# Patient Record
Sex: Male | Born: 1995 | Race: Black or African American | Hispanic: No | Marital: Single | Smoking: Never smoker
Health system: Southern US, Community
[De-identification: ages and names within clinical notes are randomized; demographics above are authoritative.]

## PROBLEM LIST (undated history)

## (undated) DIAGNOSIS — Z8669 Personal history of other diseases of the nervous system and sense organs: Secondary | ICD-10-CM

## (undated) HISTORY — PX: WRIST SURGERY: SHX841

---
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
## 2016-04-28 ENCOUNTER — Encounter: Payer: Self-pay | Admitting: Urgent Care

## 2016-04-28 DIAGNOSIS — R079 Chest pain, unspecified: Secondary | ICD-10-CM | POA: Insufficient documentation

## 2016-04-28 LAB — BASIC METABOLIC PANEL
Anion gap: 9 (ref 5–15)
BUN: 16 mg/dL (ref 6–20)
CALCIUM: 9.2 mg/dL (ref 8.9–10.3)
CO2: 26 mmol/L (ref 22–32)
CREATININE: 1.03 mg/dL (ref 0.61–1.24)
Chloride: 101 mmol/L (ref 101–111)
GFR calc Af Amer: >60 mL/min (ref >60)
GLUCOSE: 87 mg/dL (ref 65–99)
POTASSIUM: 3.2 mmol/L — AB (ref 3.5–5.1)
SODIUM: 136 mmol/L (ref 135–145)

## 2016-04-28 LAB — CBC
HEMATOCRIT: 42.2 % (ref 40.0–52.0)
Hemoglobin: 14.6 g/dL (ref 13.0–18.0)
MCH: 30.4 pg (ref 26.0–34.0)
MCHC: 34.5 g/dL (ref 32.0–36.0)
MCV: 88.2 fL (ref 80.0–100.0)
PLATELETS: 233 10*3/uL (ref 150–440)
RBC: 4.79 MIL/uL (ref 4.40–5.90)
RDW: 13.1 % (ref 11.5–14.5)
WBC: 8.2 10*3/uL (ref 3.8–10.6)

## 2016-04-28 LAB — TROPONIN I

## 2016-04-28 NOTE — ED Notes (Signed)
Patient presents to the ED with c/o chest pain. Patient reports that pain started yesterday while he was working at [employer]. Patient reports that he got fired for no reason and his chest started to hurt. Patient states, "The steam came up in my face and I couldn't breathe. I felt like I was going to pass out." Patient with continued chest pain today.

## 2016-04-29 ENCOUNTER — Emergency Department
Admission: EM | Admit: 2016-04-29 | Discharge: 2016-04-29 | Disposition: A | Discharge status: Home / Self Care | Payer: Self-pay | Attending: Emergency Medicine | Admitting: Emergency Medicine

## 2016-04-29 DIAGNOSIS — R079 Chest pain, unspecified: Secondary | ICD-10-CM

## 2016-04-29 NOTE — Discharge Instructions (Signed)
Nonspecific Chest Pain  °Chest pain can be caused by many different conditions. There is always a chance that your pain could be related to something serious, such as a heart attack or a blood clot in your lungs. Chest pain can also be caused by conditions that are not life-threatening. If you have chest pain, it is very important to follow up with your health care provider. °CAUSES  °Chest pain can be caused by: °· Heartburn. °· Pneumonia or bronchitis. °· Anxiety or stress. °· Inflammation around your heart (pericarditis) or lung (pleuritis or pleurisy). °· A blood clot in your lung. °· A collapsed lung (pneumothorax). It can develop suddenly on its own (spontaneous pneumothorax) or from trauma to the chest. °· Shingles infection (varicella-zoster virus). °· Heart attack. °· Damage to the bones, muscles, and cartilage that make up your chest wall. This can include: °¨ Bruised bones due to injury. °¨ Strained muscles or cartilage due to frequent or repeated coughing or overwork. °¨ Fracture to one or more ribs. °¨ Sore cartilage due to inflammation (costochondritis). °RISK FACTORS  °Risk factors for chest pain may include: °· Activities that increase your risk for trauma or injury to your chest. °· Respiratory infections or conditions that cause frequent coughing. °· Medical conditions or overeating that can cause heartburn. °· Heart disease or family history of heart disease. °· Conditions or health behaviors that increase your risk of developing a blood clot. °· Having had chicken pox (varicella zoster). °SIGNS AND SYMPTOMS °Chest pain can feel like: °· Burning or tingling on the surface of your chest or deep in your chest. °· Crushing, pressure, aching, or squeezing pain. °· Dull or sharp pain that is worse when you move, cough, or take a deep breath. °· Pain that is also felt in your back, neck, shoulder, or arm, or pain that spreads to any of these areas. °Your chest pain may come and go, or it may stay  constant. °DIAGNOSIS °Lab tests or other studies may be needed to find the cause of your pain. Your health care provider may have you take a test called an ambulatory ECG (electrocardiogram). An ECG records your heartbeat patterns at the time the test is performed. You may also have other tests, such as: °· Transthoracic echocardiogram (TTE). During echocardiography, sound waves are used to create a picture of all of the heart structures and to look at how blood flows through your heart. °· Transesophageal echocardiogram (TEE). This is a more advanced imaging test that obtains images from inside your body. It allows your health care provider to see your heart in finer detail. °· Cardiac monitoring. This allows your health care provider to monitor your heart rate and rhythm in real time. °· Holter monitor. This is a portable device that records your heartbeat and can help to diagnose abnormal heartbeats. It allows your health care provider to track your heart activity for several days, if needed. °· Stress tests. These can be done through exercise or by taking medicine that makes your heart beat more quickly. °· Blood tests. °· Imaging tests. °TREATMENT  °Your treatment depends on what is causing your chest pain. Treatment may include: °· Medicines. These may include: °¨ Acid blockers for heartburn. °¨ Anti-inflammatory medicine. °¨ Pain medicine for inflammatory conditions. °¨ Antibiotic medicine, if an infection is present. °¨ Medicines to dissolve blood clots. °¨ Medicines to treat coronary artery disease. °· Supportive care for conditions that do not require medicines. This may include: °¨ Resting. °¨ Applying heat   or cold packs to injured areas. °¨ Limiting activities until pain decreases. °HOME CARE INSTRUCTIONS °· If you were prescribed an antibiotic medicine, finish it all even if you start to feel better. °· Avoid any activities that bring on chest pain. °· Do not use any tobacco products, including  cigarettes, chewing tobacco, or electronic cigarettes. If you need help quitting, ask your health care provider. °· Do not drink alcohol. °· Take medicines only as directed by your health care provider. °· Keep all follow-up visits as directed by your health care provider. This is important. This includes any further testing if your chest pain does not go away. °· If heartburn is the cause for your chest pain, you may be told to keep your head raised (elevated) while sleeping. This reduces the chance that acid will go from your stomach into your esophagus. °· Make lifestyle changes as directed by your health care provider. These may include: °¨ Getting regular exercise. Ask your health care provider to suggest some activities that are safe for you. °¨ Eating a heart-healthy diet. A registered dietitian can help you to learn healthy eating options. °¨ Maintaining a healthy weight. °¨ Managing diabetes, if necessary. °¨ Reducing stress. °SEEK MEDICAL CARE IF: °· Your chest pain does not go away after treatment. °· You have a rash with blisters on your chest. °· You have a fever. °SEEK IMMEDIATE MEDICAL CARE IF:  °· Your chest pain is worse. °· You have an increasing cough, or you cough up blood. °· You have severe abdominal pain. °· You have severe weakness. °· You faint. °· You have chills. °· You have sudden, unexplained chest discomfort. °· You have sudden, unexplained discomfort in your arms, back, neck, or jaw. °· You have shortness of breath at any time. °· You suddenly start to sweat, or your skin gets clammy. °· You feel nauseous or you vomit. °· You suddenly feel light-headed or dizzy. °· Your heart begins to beat quickly, or it feels like it is skipping beats. °These symptoms may represent a serious problem that is an emergency. Do not wait to see if the symptoms will go away. Get medical help right away. Call your local emergency services (911 in the [place] Do not drive yourself to the hospital. °  °This  information is not intended to replace advice given to you by your health care provider. Make sure you discuss any questions you have with your health care provider. °  °Document Released: 08/10/2005 Document Revised: 11/21/2014 Document Reviewed: 06/06/2014 °Elsevier Interactive Patient Education ©2016 [employer] ° °

## 2016-04-29 NOTE — ED Provider Notes (Signed)
[facility] Emergency Department Provider Note  ____________________________________________  Time seen: 2:00 AM  I have reviewed the triage vital signs and the nursing notes.   HISTORY  Chief Complaint Chest Pain      HPI Fernando Kemp is a 20 y.o. male presents with history of chest pain which is since resolved while working at [employer]. Patient states that the air condition unit at [employer] is not working and a such while he was working yesterday was very hot. Patient states he felt as though he was going to "pass out". As a result the patient stated that he informed his manager that he needed to seek medical attention however he was informed that if he did so he would be terminated. Patient states that he left work and following doing so his symptoms resolved. Patient denies any chest pain at present no shortness of breath     Past medical history None There are no active problems to display for this patient.   Past Surgical History  Procedure Laterality Date  . Wrist surgery Left     No current outpatient prescriptions on file.  Allergies Review of patient's allergies indicates no known allergies.  No family history on file.  Social History Social History  Substance Use Topics  . Smoking status: Never Smoker   . Smokeless tobacco: None  . Alcohol Use: No    Review of Systems  Constitutional: Negative for fever. Eyes: Negative for visual changes. ENT: Negative for sore throat. Cardiovascular: Negative for chest pain. Respiratory: Negative for shortness of breath. Gastrointestinal: Negative for abdominal pain, vomiting and diarrhea. Genitourinary: Negative for dysuria. Musculoskeletal: Negative for back pain. Skin: Negative for rash. Neurological: Negative for headaches, focal weakness or numbness.   10-point ROS otherwise negative.  ____________________________________________   PHYSICAL EXAM:  VITAL  SIGNS: ED Triage Vitals  Enc Vitals Group     BP 04/28/16 2311 152/82 mmHg     Pulse Rate 04/28/16 2311 66     Resp 04/28/16 2311 16     Temp 04/28/16 2311 98.6 F (37 C)     Temp Source 04/28/16 2311 Oral     SpO2 04/28/16 2311 100 %     Weight 04/28/16 2308 181 lb 6.4 oz (82.283 kg)     Height 04/28/16 2308  (1.727 m)     Head Cir --      Peak Flow --      Pain Score 04/28/16 2308 7     Pain Loc --      Pain Edu? --      Excl. in GC? --      Constitutional: Alert and oriented. Well appearing and in no distress. Eyes: Conjunctivae are normal. PERRL. Normal extraocular movements. ENT   Head: Normocephalic and atraumatic.   Nose: No congestion/rhinnorhea.   Mouth/Throat: Mucous membranes are moist.   Neck: No stridor. Hematological/Lymphatic/Immunilogical: No cervical lymphadenopathy. Cardiovascular: Normal rate, regular rhythm. Normal and symmetric distal pulses are present in all extremities. No murmurs, rubs, or gallops. Respiratory: Normal respiratory effort without tachypnea nor retractions. Breath sounds are clear and equal bilaterally. No wheezes/rales/rhonchi. Gastrointestinal: Soft and nontender. No distention. There is no CVA tenderness. Genitourinary: deferred Musculoskeletal: Nontender with normal range of motion in all extremities. No joint effusions.  No lower extremity tenderness nor edema. Neurologic:  Normal speech and language. No gross focal neurologic deficits are appreciated. Speech is normal.  Skin:  Skin is warm, dry and intact. No rash noted.  Psychiatric: Mood and affect are normal. Speech and behavior are normal. Patient exhibits appropriate insight and judgment.  ____________________________________________    LABS (pertinent positives/negatives) Labs Reviewed  BASIC METABOLIC PANEL - Abnormal; Notable for the following:    Potassium 3.2 (*)    All other components within normal limits  CBC  TROPONIN I      ____________________________________________   EKG  ED ECG REPORT I, Orland N Shontae Rosiles, the attending physician, personally viewed and interpreted this ECG.   Date: 04/29/2016  EKG Time: 11:09 PM  Rate: 97  Rhythm: Normal sinus rhythm  Axis: Normal  Intervals: Normal  ST&T Change: None       INITIAL IMPRESSION / ASSESSMENT AND PLAN / ED COURSE  Pertinent labs & imaging results that were available during my care of the patient were reviewed by me and considered in my medical decision making (see chart for details).  Suspect possible heat exhaustion as etiology for the patient's symptoms yesterday. Patient has no symptoms at present. States he came to the emergency department because he needed a work note permission to return to work ____________________________________________   FINAL CLINICAL IMPRESSION(S) / ED DIAGNOSES  Final diagnoses:  Chest pain, unspecified chest pain type      Darci Currentandolph N Corazon Nickolas, MD 04/29/16 [phone]

## 2017-03-06 ENCOUNTER — Emergency Department
Admission: EM | Admit: 2017-03-06 | Discharge: 2017-03-06 | Disposition: A | Discharge status: Home / Self Care | Payer: 59 | Attending: Emergency Medicine | Admitting: Emergency Medicine

## 2017-03-06 DIAGNOSIS — L249 Irritant contact dermatitis, unspecified cause: Secondary | ICD-10-CM | POA: Diagnosis not present

## 2017-03-06 DIAGNOSIS — N4889 Other specified disorders of penis: Secondary | ICD-10-CM | POA: Diagnosis present

## 2017-03-06 MED ORDER — HYDROCORTISONE 2.5 % EX OINT: TOPICAL_OINTMENT | Freq: Two times a day (BID) | CUTANEOUS | 0 refills | Status: DC

## 2017-03-06 NOTE — ED Triage Notes (Signed)
Pt states he needs to be checked "down there". Pt states he saw some bumps on his penis today.

## 2017-03-06 NOTE — Discharge Instructions (Signed)
Please follow up with the [facility] Department for full STD screening if symptoms are not improving.  If you are using condoms or lubricants, they may be the cause of your symptoms. Change brands if so.  Return to the ER for symptoms that change or worsen if unable to schedule an appointment.

## 2017-03-06 NOTE — ED Provider Notes (Signed)
[facility] Emergency Department Provider Note  ___________________________________________   First MD Initiated Contact with Patient 03/06/17 1820     (approximate)  I have reviewed the triage vital signs and the nursing notes.   HISTORY  Chief Complaint Possible STD  HPI Fernando Kemp is a 21 y.o. male who presents to the emergency department for evaluation of STDs. He states he noticed some bumps on his penis today.Patient denies any dysuria or penile discharge. He has not had any pain. He is very vague about his symptoms and does not provide any details in recent use of condoms or lubricants.  No past medical history on file.  There are no active problems to display for this patient.   Past Surgical History:  Procedure Laterality Date  . WRIST SURGERY Left     Prior to Admission medications   Medication Sig Start Date End Date Taking? Authorizing Provider  hydrocortisone 2.5 % ointment Apply topically 2 (two) times daily. 03/06/17   Chinita Pester, FNP    Allergies Patient has no known allergies.  No family history on file.  Social History Social History  Substance Use Topics  . Smoking status: Never Smoker  . Smokeless tobacco: Not on file  . Alcohol use No    Review of Systems Constitutional: No fever/chills Gastrointestinal: No abdominal pain.  No nausea, no vomiting.   Genitourinary: Negative for dysuria. Negative for penile discharge. Musculoskeletal: Negative for back pain. Skin: Positive for "bumps" on glans Neurological: Negative for headaches, focal weakness or numbness. ____________________________________________   PHYSICAL EXAM:  VITAL SIGNS: ED Triage Vitals [03/06/17 1743]  Enc Vitals Group     BP (!) 130/97     Pulse Rate 76     Resp 18     Temp 98.3 F (36.8 C)     Temp Source Oral     SpO2 98 %     Weight 187 lb (84.8 kg)     Height  (1.727 m)     Head Circumference      Peak Flow      Pain  Score      Pain Loc      Pain Edu?      Excl. in GC?     Constitutional: Alert and oriented. Well appearing and in no acute distress. Eyes: Conjunctivae are normal. EOMI. Respiratory: Normal respiratory effort.  No retractions.  Gastrointestinal: Soft and nontender. No distention.  Genitourinary: Skin colored papules on glans of penis without ulceration/umbilication that do not appear wartlike. Papules are diffuse and localized to the glans and on the cuff, but do not spread to the shaft.  Musculoskeletal: No lower extremity tenderness nor edema.  No joint effusions. Neurologic:  Normal speech and language. No gross focal neurologic deficits are appreciated.  Skin:  Skin is warm, dry and intact. No rash noted. See GU exam above. Psychiatric: Mood and affect are normal. Speech and behavior are normal. ____________________________________________   LABS (all labs ordered are listed, but only abnormal results are displayed)  Labs Reviewed - No data to display ____________________________________________  EKG  Not indicated ____________________________________________  RADIOLOGY  Not indicated. ____________________________________________   PROCEDURES  Procedure(s) performed: None  Procedures  Critical Care performed: No  ____________________________________________   INITIAL IMPRESSION / ASSESSMENT AND PLAN / ED COURSE  Pertinent labs & imaging results that were available during my care of the patient were reviewed by me and considered in my medical decision making (see chart for  details).  21 year old male presenting to the emergency department after noticing "bumps" on his penis this morning. He denies having a history of the same. He does not specifically tell me that he has had any recent lubricants or condom use, however symptoms and exam seem to be consistent with an irritant/contact dermatitis. He was given a prescription for some hydrocortisone cream and advised  to follow-up at the free STD clinic at the health department for a full STD exam if the symptoms do not resolve over the next few days. ____________________________________________   FINAL CLINICAL IMPRESSION(S) / ED DIAGNOSES  Final diagnoses:  Irritant contact dermatitis, unspecified trigger      NEW MEDICATIONS STARTED DURING THIS VISIT:  Discharge Medication List as of 03/06/2017  6:44 PM    START taking these medications   Details  hydrocortisone 2.5 % ointment Apply topically 2 (two) times daily., Starting Mon 03/06/2017, Print         Note:  This document was prepared using Dragon voice recognition software and may include unintentional dictation errors.    Chinita Pester, FNP 03/06/17 2012    Jeanmarie Plant, MD 03/06/17 2150

## 2017-04-24 ENCOUNTER — Encounter: Payer: Self-pay | Admitting: Emergency Medicine

## 2017-04-24 ENCOUNTER — Emergency Department: Payer: 59

## 2017-04-24 ENCOUNTER — Emergency Department
Admission: EM | Admit: 2017-04-24 | Discharge: 2017-04-24 | Disposition: A | Discharge status: Home / Self Care | Payer: 59 | Attending: Emergency Medicine | Admitting: Emergency Medicine

## 2017-04-24 DIAGNOSIS — Y939 Activity, unspecified: Secondary | ICD-10-CM | POA: Diagnosis not present

## 2017-04-24 DIAGNOSIS — S6701XA Crushing injury of right thumb, initial encounter: Secondary | ICD-10-CM | POA: Diagnosis not present

## 2017-04-24 DIAGNOSIS — W231XXA Caught, crushed, jammed, or pinched between stationary objects, initial encounter: Secondary | ICD-10-CM | POA: Insufficient documentation

## 2017-04-24 DIAGNOSIS — Y929 Unspecified place or not applicable: Secondary | ICD-10-CM | POA: Diagnosis not present

## 2017-04-24 DIAGNOSIS — Y999 Unspecified external cause status: Secondary | ICD-10-CM | POA: Insufficient documentation

## 2017-04-24 MED ORDER — NAPROXEN 500 MG PO TABS
500.0000 mg | ORAL_TABLET | Freq: Once | ORAL | Status: AC
  Administered 2017-04-24: 500 mg via ORAL
  Filled 2017-04-24: qty 1

## 2017-04-24 MED ORDER — NAPROXEN 500 MG PO TABS: 500.0000 mg | ORAL_TABLET | Freq: Two times a day (BID) | ORAL | 0 refills | Status: DC

## 2017-04-24 NOTE — ED Provider Notes (Signed)
[facility] Emergency Department Provider Note ____________________________________________  Time seen: Approximately 8:08 PM  I have reviewed the triage vital signs and the nursing notes.   HISTORY  Chief Complaint Finger Injury    HPI Fernando Kemp is a 21 y.o. male who presents to the emergency department for evaluation ofright thumb pain. While moving furniture today he smashed his right thumb. He has some swelling and bruising noted to the base of the thumbnail. He has not taken any medications for pain relief prior to arrival.  History reviewed. No pertinent past medical history.  There are no active problems to display for this patient.   Past Surgical History:  Procedure Laterality Date  . WRIST SURGERY Left     Prior to Admission medications   Medication Sig Start Date End Date Taking? Authorizing Provider  hydrocortisone 2.5 % ointment Apply topically 2 (two) times daily. 03/06/17   Azaiah Licciardi, Rulon Eisenmengerari B, FNP  naproxen (NAPROSYN) 500 MG tablet Take 1 tablet (500 mg total) by mouth 2 (two) times daily with a meal. 04/24/17   Feliz Lincoln B, FNP    Allergies Patient has no known allergies.  History reviewed. No pertinent family history.  Social History Social History  Substance Use Topics  . Smoking status: Never Smoker  . Smokeless tobacco: Never Used  . Alcohol use No    Review of Systems Constitutional: Negative for fever or recent injury Respiratory: Negative for shortness of breath or cough Musculoskeletal: Positive for right thumb pain post injury Skin: Positive for bruising and swelling over the right thumb  Neurological: Negative for decrease in sensation of the right thumb or paresthesias.  ____________________________________________   PHYSICAL EXAM:  VITAL SIGNS: ED Triage Vitals  Enc Vitals Group     BP 04/24/17 1926 (!) 140/91     Pulse Rate 04/24/17 1926 90     Resp 04/24/17 1926 16     Temp 04/24/17 1926 98 F  (36.7 C)     Temp Source 04/24/17 1926 Oral     SpO2 04/24/17 1926 99 %     Weight 04/24/17 1925 187 lb (84.8 kg)     Height --      Head Circumference --      Peak Flow --      Pain Score 04/24/17 1925 6     Pain Loc --      Pain Edu? --      Excl. in GC? --     Constitutional: Alert and oriented. Well appearing and in no acute distress. Eyes: Conjunctivae are clear bilaterally  Head: Atraumatic Neck: Active, full range of motion Respiratory: Respirations are even and unlabored Musculoskeletal: Full, active range of motion throughout, specifically the right hand. There is focal tenderness over the base of the right thumbnail without injury to the nail itself. Neurologic: Sensation is intact, specifically of the right thumb.  Skin: Contusion noted at the base of the right thumbnail.  Psychiatric: Normal affect and behavior.  ____________________________________________   LABS (all labs ordered are listed, but only abnormal results are displayed)  Labs Reviewed - No data to display ____________________________________________  RADIOLOGY  Images of the right thumb is negative for acute bony abnormality per radiology. ____________________________________________   PROCEDURES  Procedure(s) performed: Static splint was applied to the right thumb by RN. Patient was neurovascularly intact post-application.  ____________________________________________   INITIAL IMPRESSION / ASSESSMENT AND PLAN / ED COURSE  Fernando Kemp is a 21 y.o. male who presents to  the emergency department for evaluation of injury sustained to the right thumb today. Images and exam are both consistent. No concern of indication of fracture. Splint was applied for comfort and protection until area is no longer tender and his heel. He was instructed to follow up with his primary care provider or orthopedics for symptoms that are not improving over the week. He is to return to the emergency department for  symptoms that change or worsen if he is unable to schedule an appointment.  Pertinent labs & imaging results that were available during my care of the patient were reviewed by me and considered in my medical decision making (see chart for details).  _________________________________________   FINAL CLINICAL IMPRESSION(S) / ED DIAGNOSES  Final diagnoses:  Crush injury to thumb, right, initial encounter    New Prescriptions   NAPROXEN (NAPROSYN) 500 MG TABLET    Take 1 tablet (500 mg total) by mouth 2 (two) times daily with a meal.    If controlled substance prescribed during this visit, 12 month history viewed on the [facility] prior to issuing an initial prescription for Schedule II or III opiod.    Fernando Pester, FNP 04/24/17 2027    Myrna Blazer, MD 04/24/17 2111

## 2017-04-24 NOTE — ED Triage Notes (Signed)
Pt ambulatory to triage with steady gait, no distress noted. Pt reports he was moving furniture today when he smashed his right thumb into a wall. Pts right thumb is bruised and swollen, no obvious deformity noted at this time.

## 2017-04-24 NOTE — ED Notes (Addendum)
See triage note..the patient reports to ED w/ c/o pain/injury to R thumb after injury. Bruising and swelling noted to area, thumbnail intact. Circulation, sensation, motor function intact. Pt A/OX4, resp even and unlabored. NAD

## 2017-04-24 NOTE — Discharge Instructions (Signed)
Follow up with the primary care or orthopedic doctor for symptoms that are not improving over the next few days. Return to the ER for symptoms that change or worsen if unable to schedule an appointment.

## 2017-09-13 ENCOUNTER — Encounter: Payer: Self-pay | Admitting: Emergency Medicine

## 2017-09-13 ENCOUNTER — Emergency Department
Admission: EM | Admit: 2017-09-13 | Discharge: 2017-09-13 | Disposition: A | Discharge status: Home / Self Care | Payer: 59 | Attending: Emergency Medicine | Admitting: Emergency Medicine

## 2017-09-13 DIAGNOSIS — R1110 Vomiting, unspecified: Secondary | ICD-10-CM | POA: Diagnosis present

## 2017-09-13 DIAGNOSIS — R112 Nausea with vomiting, unspecified: Secondary | ICD-10-CM | POA: Diagnosis not present

## 2017-09-13 LAB — CBC
HCT: 40.6 % (ref 40.0–52.0)
HEMOGLOBIN: 13.8 g/dL (ref 13.0–18.0)
MCH: 29.7 pg (ref 26.0–34.0)
MCHC: 34 g/dL (ref 32.0–36.0)
MCV: 87.5 fL (ref 80.0–100.0)
PLATELETS: 227 10*3/uL (ref 150–440)
RBC: 4.64 MIL/uL (ref 4.40–5.90)
RDW: 13.1 % (ref 11.5–14.5)
WBC: 7.2 10*3/uL (ref 3.8–10.6)

## 2017-09-13 LAB — URINALYSIS, COMPLETE (UACMP) WITH MICROSCOPIC
Bacteria, UA: NONE SEEN
Bilirubin Urine: NEGATIVE
GLUCOSE, UA: NEGATIVE mg/dL
HGB URINE DIPSTICK: NEGATIVE
KETONES UR: NEGATIVE mg/dL
LEUKOCYTES UA: NEGATIVE
NITRITE: NEGATIVE
PH: 6 (ref 5.0–8.0)
PROTEIN: 30 mg/dL — AB
RBC / HPF: NONE SEEN RBC/hpf (ref 0–5)
Specific Gravity, Urine: 1.027 (ref 1.005–1.030)
Squamous Epithelial / LPF: NONE SEEN

## 2017-09-13 LAB — COMPREHENSIVE METABOLIC PANEL
ALK PHOS: 80 U/L (ref 38–126)
ALT: 30 U/L (ref 17–63)
ANION GAP: 7 (ref 5–15)
AST: 28 U/L (ref 15–41)
Albumin: 4.3 g/dL (ref 3.5–5.0)
BUN: 17 mg/dL (ref 6–20)
CHLORIDE: 104 mmol/L (ref 101–111)
CO2: 26 mmol/L (ref 22–32)
Calcium: 9.1 mg/dL (ref 8.9–10.3)
Creatinine, Ser: 1.1 mg/dL (ref 0.61–1.24)
Glucose, Bld: 122 mg/dL — ABNORMAL HIGH (ref 65–99)
POTASSIUM: 3.5 mmol/L (ref 3.5–5.1)
SODIUM: 137 mmol/L (ref 135–145)
Total Bilirubin: 0.7 mg/dL (ref 0.3–1.2)
Total Protein: 8 g/dL (ref 6.5–8.1)

## 2017-09-13 LAB — LIPASE, BLOOD: LIPASE: 16 U/L (ref 11–51)

## 2017-09-13 MED ORDER — ONDANSETRON 4 MG PO TBDP
4.0000 mg | ORAL_TABLET | Freq: Once | ORAL | Status: AC
  Administered 2017-09-13: 4 mg via ORAL
  Filled 2017-09-13: qty 1

## 2017-09-13 MED ORDER — ONDANSETRON 4 MG PO TBDP: 4.0000 mg | ORAL_TABLET | Freq: Three times a day (TID) | ORAL | 0 refills | Status: DC | PRN

## 2017-09-13 NOTE — ED Triage Notes (Signed)
Pt states started vomiting around 1330 states x 2 with mid abd pain. States [place] around 1300 not sure if it is from food.

## 2017-09-13 NOTE — ED Provider Notes (Signed)
[facility] Emergency Department Provider Note  Time seen: 8:32 PM  I have reviewed the triage vital signs and the nursing notes.   HISTORY  Chief Complaint Emesis    HPI Fernando Kemp is a 21 y.o. male with no past medical history who presents to the emergency department for nausea vomiting.  According to the patient he was eating spaghetti around 1:00 and around 130 he vomited up.  He states since he has not had any further vomiting but does continue to feel nauseated with an upset stomach.  Denies diarrhea.  Denies any abdominal "pain."  No fever.   History reviewed. No pertinent past medical history.  There are no active problems to display for this patient.   Past Surgical History:  Procedure Laterality Date  . WRIST SURGERY Left     Prior to Admission medications   Medication Sig Start Date End Date Taking? Authorizing Provider  hydrocortisone 2.5 % ointment Apply topically 2 (two) times daily. 03/06/17   Triplett, Rulon Eisenmenger B, FNP  naproxen (NAPROSYN) 500 MG tablet Take 1 tablet (500 mg total) by mouth 2 (two) times daily with a meal. 04/24/17   Triplett, Cari B, FNP    No Known Allergies  No family history on file.  Social History Social History  Substance Use Topics  . Smoking status: Never Smoker  . Smokeless tobacco: Never Used  . Alcohol use No    Review of Systems Constitutional: Negative for fever. Cardiovascular: Negative for chest pain. Respiratory: Negative for shortness of breath. Gastrointestinal: Negative for abdominal pain.  Positive for nausea and vomiting.  Negative for diarrhea Neurological: Negative for headache All other ROS negative  ____________________________________________   PHYSICAL EXAM:  VITAL SIGNS: ED Triage Vitals [09/13/17 1933]  Enc Vitals Group     BP 134/78     Pulse Rate 96     Resp 20     Temp 98.6 F (37 C)     Temp Source Oral     SpO2 99 %     Weight 190 lb (86.2 kg)     Height 5\' 8"   (1.727 m)     Head Circumference      Peak Flow      Pain Score 6     Pain Loc      Pain Edu?      Excl. in GC?     Constitutional: Alert and oriented. Well appearing and in no distress. Eyes: Normal exam ENT   Head: Normocephalic and atraumatic   Mouth/Throat: Mucous membranes are moist. Cardiovascular: Normal rate, regular rhythm. No murmur Respiratory: Normal respiratory effort without tachypnea nor retractions. Breath sounds are clear  Gastrointestinal: Soft and nontender. No distention.   Musculoskeletal: Nontender with normal range of motion in all extremities.  Neurologic:  Normal speech and language. No gross focal neurologic deficits Skin:  Skin is warm, dry and intact.  Psychiatric: Mood and affect are normal.     INITIAL IMPRESSION / ASSESSMENT AND PLAN / ED COURSE  Pertinent labs & imaging results that were available during my care of the patient were reviewed by me and considered in my medical decision making (see chart for details).  Patient presents to the emergency department for nausea and vomiting beginning around 1:30 PM today.  Differential includes gastroenteritis, emesis, pancreatitis, biliary dysfunction.  Overall the patient appears extremely well with a completely nontender abdominal exam.  Continue states some nausea.  We will does a one-time dose of ODT Zofran  and discharged with the same to be used as needed.  I discussed supportive care at home including fluids and Imodium if needed.  ____________________________________________   FINAL CLINICAL IMPRESSION(S) / ED DIAGNOSES  Emesis    Minna AntisPaduchowski, Angeligue Bowne, MD 09/13/17 2034

## 2018-02-18 ENCOUNTER — Other Ambulatory Visit: Payer: Self-pay

## 2018-02-18 ENCOUNTER — Emergency Department: Payer: 59

## 2018-02-18 ENCOUNTER — Encounter: Payer: Self-pay | Admitting: Emergency Medicine

## 2018-02-18 DIAGNOSIS — Y92039 Unspecified place in apartment as the place of occurrence of the external cause: Secondary | ICD-10-CM | POA: Diagnosis not present

## 2018-02-18 DIAGNOSIS — Y999 Unspecified external cause status: Secondary | ICD-10-CM | POA: Insufficient documentation

## 2018-02-18 DIAGNOSIS — S60221A Contusion of right hand, initial encounter: Secondary | ICD-10-CM | POA: Diagnosis not present

## 2018-02-18 DIAGNOSIS — Y939 Activity, unspecified: Secondary | ICD-10-CM | POA: Insufficient documentation

## 2018-02-18 DIAGNOSIS — R079 Chest pain, unspecified: Secondary | ICD-10-CM | POA: Diagnosis present
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
## 2018-02-19 ENCOUNTER — Emergency Department
Admission: EM | Admit: 2018-02-19 | Discharge: 2018-02-19 | Disposition: A | Discharge status: Home / Self Care | Payer: 59 | Attending: Emergency Medicine | Admitting: Emergency Medicine

## 2018-02-19 DIAGNOSIS — R0789 Other chest pain: Secondary | ICD-10-CM

## 2018-02-19 DIAGNOSIS — S60221A Contusion of right hand, initial encounter: Secondary | ICD-10-CM

## 2018-02-19 MED ORDER — IBUPROFEN 800 MG PO TABS: 800.0000 mg | ORAL_TABLET | Freq: Three times a day (TID) | ORAL | 0 refills | Status: DC | PRN

## 2018-02-19 MED ORDER — KETOROLAC TROMETHAMINE 60 MG/2ML IM SOLN
60.0000 mg | Freq: Once | INTRAMUSCULAR | Status: AC
  Administered 2018-02-19: 60 mg via INTRAMUSCULAR
  Filled 2018-02-19: qty 2
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
## 2018-02-19 NOTE — Discharge Instructions (Signed)
Please follow-up with the acute care clinic.  Please return with any other conditions or any concerns.
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
## 2018-04-16 ENCOUNTER — Ambulatory Visit
Admission: EM | Admit: 2018-04-16 | Discharge: 2018-04-16 | Disposition: A | Discharge status: Home / Self Care | Payer: 59 | Attending: Family Medicine | Admitting: Family Medicine

## 2018-04-16 ENCOUNTER — Other Ambulatory Visit: Payer: Self-pay

## 2018-04-16 DIAGNOSIS — R51 Headache: Secondary | ICD-10-CM

## 2018-04-16 DIAGNOSIS — R519 Headache, unspecified: Secondary | ICD-10-CM

## 2018-04-16 NOTE — ED Provider Notes (Signed)
[facility]    CSN: [id number] Arrival date & time: 04/16/18  1603     History   Chief Complaint Chief Complaint  Patient presents with  . Headache    HPI Fernando Kemp is a 22 y.o. male.   Presents to the urgent care facility for evaluation of headache.  Patient states earlier today around lunch he was eating, got hot and dizzy and developed a headache and one episode of vomiting.  Patient states he is no longer dizzy, hot and without nausea and vomiting.  His headache has improved tremendously and is currently 5 out of 10.  Patient has a history of migraine headaches with similar symptoms preceding his headache.  He has not take any medications for his pain.  He denies any sore throat, abdominal pain, rashes, vision changes.  No trauma or injury.  Denies worst headache of his life.  HPI  History reviewed. No pertinent past medical history.  There are no active problems to display for this patient.   Past Surgical History:  Procedure Laterality Date  . WRIST SURGERY Left        Home Medications    Prior to Admission medications   Medication Sig Start Date End Date Taking? Authorizing Provider  ibuprofen (ADVIL,MOTRIN) 800 MG tablet Take 1 tablet (800 mg total) by mouth every 8 (eight) hours as needed. 02/19/18   Rebecka ApleyWebster, Allison P, MD    Family History History reviewed. No pertinent family history.  Social History Social History   Tobacco Use  . Smoking status: Never Smoker  . Smokeless tobacco: Never Used  Substance Use Topics  . Alcohol use: No  . Drug use: Not Currently    Types: Marijuana     Allergies   Patient has no known allergies.   Review of Systems Review of Systems  Constitutional: Negative for fever.  HENT: Negative for sore throat and trouble swallowing.   Respiratory: Negative for shortness of breath.   Cardiovascular: Negative for chest pain.  Gastrointestinal: Positive for nausea and vomiting. Negative for abdominal pain.    Genitourinary: Negative for difficulty urinating, dysuria and urgency.  Musculoskeletal: Negative for back pain and myalgias.  Skin: Negative for rash.  Neurological: Positive for headaches. Negative for dizziness and light-headedness.     Physical Exam Triage Vital Signs ED Triage Vitals  Enc Vitals Group     BP 04/16/18 1618 (!) 142/86     Pulse Rate 04/16/18 1618 (!) 53     Resp 04/16/18 1618 18     Temp 04/16/18 1618 98.9 F (37.2 C)     Temp Source 04/16/18 1618 Oral     SpO2 04/16/18 1618 98 %     Weight 04/16/18 1618 180 lb (81.6 kg)     Height 04/16/18 1618 5\' 6"  (1.676 m)     Head Circumference --      Peak Flow --      Pain Score 04/16/18 1617 6     Pain Loc --      Pain Edu? --      Excl. in GC? --    No data found.  Updated Vital Signs BP (!) 142/86 (BP Location: Left Arm)   Pulse (!) 53   Temp 98.9 F (37.2 C) (Oral)   Resp 18   Ht 5\' 6"  (1.676 m)   Wt 180 lb (81.6 kg)   SpO2 98%   BMI 29.05 kg/m   Visual Acuity Right Eye Distance:   Left Eye  Distance:   Bilateral Distance:    Right Eye Near:   Left Eye Near:    Bilateral Near:     Physical Exam  Constitutional: He is oriented to person, place, and time. He appears well-developed and well-nourished.  HENT:  Head: Normocephalic and atraumatic.  Mouth/Throat: Oropharynx is clear and moist. No oropharyngeal exudate.  Eyes: Conjunctivae are normal.  Neck: Normal range of motion.  Cardiovascular: Normal rate.  Pulmonary/Chest: Effort normal. No respiratory distress.  Abdominal: Soft. He exhibits no distension. There is no tenderness.  Musculoskeletal: Normal range of motion.  Lymphadenopathy:    He has no cervical adenopathy.  Neurological: He is alert and oriented to person, place, and time. He has normal strength. He is not disoriented. He displays a negative Romberg sign. Coordination and gait normal.  Skin: Skin is warm. No rash noted.  Psychiatric: He has a normal mood and affect. His  behavior is normal. Thought content normal.     UC Treatments / Results  Labs (all labs ordered are listed, but only abnormal results are displayed) Labs Reviewed - No data to display  EKG None  Radiology No results found.  Procedures Procedures (including critical care time)  Medications Ordered in UC Medications - No data to display  Initial Impression / Assessment and Plan / UC Course  I have reviewed the triage vital signs and the nursing notes.  Pertinent labs & imaging results that were available during my care of the patient were reviewed by me and considered in my medical decision making (see chart for details).     22 year old male with headache earlier today that similar to his normal headaches.  He had some preceding symptoms of feeling hot dizzy and one episode of nausea and vomiting.  He is no longer hot dizzy and without nausea and vomiting.  His headache is 5 out of 10.  This is not the worst headache of his life.  Patient appears well no apparent distress.  Patient states he is here for the work note.  Patient educated on signs and symptoms return to clinic for.  He will treat headache with Tylenol and ibuprofen. Final Clinical Impressions(s) / UC Diagnoses   Final diagnoses:  Acute nonintractable headache, unspecified headache type     Discharge Instructions     Please take Tylenol and ibuprofen as needed for any pain.  Return to the urgent care facility for any worsening symptoms or urgent changes in health.   ED Prescriptions    None       Evon Slack, [place] 04/16/18 1728

## 2018-04-16 NOTE — ED Triage Notes (Signed)
Patient complains of headache that occurred after eating [employer]. Patient states that he got hot, and nausea after eating and vomited. Patient denies any vomiting since. States that now his head is hurting.

## 2018-04-16 NOTE — Discharge Instructions (Addendum)
Please take Tylenol and ibuprofen as needed for any pain.  Return to the urgent care facility for any worsening symptoms or urgent changes in health.

## 2018-08-14 ENCOUNTER — Encounter (HOSPITAL_COMMUNITY): Payer: Self-pay | Admitting: Emergency Medicine

## 2018-08-14 ENCOUNTER — Emergency Department (HOSPITAL_COMMUNITY)
Admission: EM | Admit: 2018-08-14 | Discharge: 2018-08-14 | Disposition: A | Discharge status: Home / Self Care | Payer: 59 | Attending: Emergency Medicine | Admitting: Emergency Medicine

## 2018-08-14 ENCOUNTER — Other Ambulatory Visit: Payer: Self-pay

## 2018-08-14 DIAGNOSIS — R51 Headache: Secondary | ICD-10-CM | POA: Insufficient documentation

## 2018-08-14 DIAGNOSIS — R519 Headache, unspecified: Secondary | ICD-10-CM

## 2018-08-14 MED ORDER — ACETAMINOPHEN 325 MG PO TABS
650.0000 mg | ORAL_TABLET | Freq: Once | ORAL | Status: AC
  Administered 2018-08-14: 650 mg via ORAL
  Filled 2018-08-14: qty 2

## 2018-08-14 MED ORDER — IBUPROFEN 800 MG PO TABS: 800.0000 mg | ORAL_TABLET | Freq: Three times a day (TID) | ORAL | 0 refills | Status: DC

## 2018-08-14 NOTE — Discharge Instructions (Signed)
Follow-up with your primary doctor or return to ER for any worsening symptoms.

## 2018-08-14 NOTE — ED Notes (Signed)
Pt states migraine began this afternoon and has been progressively getting worse. 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 Pt denies taking any medications for this and states he has been staying hydrated. Pt denies N/V or dizziness.

## 2018-08-14 NOTE — ED Triage Notes (Signed)
Headache since 1400.  Denies any other s/s

## 2018-08-16 NOTE — ED Provider Notes (Signed)
[facility] EMERGENCY DEPARTMENT Provider Note   CSN: [id number] Arrival date & time: 08/14/18  1832     History   Chief Complaint Chief Complaint  Patient presents with  . Migraine    HPI Fernando Kemp is a 22 y.o. male.  HPI   Fernando Kemp is a 22 y.o. male who presents to the Emergency Department complaining of left-sided headache that began gradually on the afternoon of arrival.  He describes the pain as a throbbing sensation to the left side of his head.  He states that his headache has since improved after eating and now describes the headache as minimal.  He denies visual changes, vomiting, neck pain, dizziness, numbness or weakness of the extremities.  Reports history of frequent headaches.  History reviewed. No pertinent past medical history.  There are no active problems to display for this patient.   Past Surgical History:  Procedure Laterality Date  . WRIST SURGERY Left      Home Medications    Prior to Admission medications   Medication Sig Start Date End Date Taking? Authorizing Provider  ibuprofen (ADVIL,MOTRIN) 800 MG tablet Take 1 tablet (800 mg total) by mouth 3 (three) times daily. 08/14/18   Mandalyn Pasqua, Babette Relic, PA-C    Family History No family history on file.  Social History Social History   Tobacco Use  . Smoking status: Never Smoker  . Smokeless tobacco: Never Used  Substance Use Topics  . Alcohol use: No  . Drug use: Not Currently    Types: Marijuana     Allergies   Patient has no known allergies.   Review of Systems Review of Systems  Constitutional: Negative for activity change, appetite change and fever.  HENT: Negative for facial swelling and trouble swallowing.   Eyes: Negative for photophobia and visual disturbance.  Respiratory: Negative for shortness of breath.   Cardiovascular: Negative for chest pain.  Gastrointestinal: Negative for nausea and vomiting.  Musculoskeletal: Negative for neck pain and neck stiffness.    Skin: Negative for rash and wound.  Neurological: Positive for headaches. Negative for dizziness, syncope, facial asymmetry, speech difficulty, weakness and numbness.  Psychiatric/Behavioral: Negative for confusion and decreased concentration.     Physical Exam Updated Vital Signs BP (!) 145/74 (BP Location: Right Arm)   Pulse 80   Temp 98.6 F (37 C) (Oral)   Resp 18   Ht 5\' 6"  (1.676 m)   Wt 81.6 kg   SpO2 96%   BMI 29.05 kg/m   Physical Exam  Constitutional: He is oriented to person, place, and time. He appears well-developed and well-nourished. No distress.  HENT:  Head: Normocephalic and atraumatic.  Mouth/Throat: Oropharynx is clear and moist.  Eyes: Pupils are equal, round, and reactive to light. Conjunctivae and EOM are normal.  Neck: Normal range of motion, full passive range of motion without pain and phonation normal. Neck supple. No spinous process tenderness and no muscular tenderness present. No neck rigidity. No Kernig's sign noted.  Cardiovascular: Normal rate, regular rhythm and intact distal pulses.  Pulmonary/Chest: Effort normal and breath sounds normal. No respiratory distress.  Musculoskeletal: Normal range of motion.  Neurological: He is alert and oriented to person, place, and time. He has normal strength. No sensory deficit. Coordination and gait normal. GCS eye subscore is 4. GCS verbal subscore is 5. GCS motor subscore is 6.  Reflex Scores:      Tricep reflexes are 2+ on the right side and 2+ on the left side.  Bicep reflexes are 2+ on the right side and 2+ on the left side. CN III-XII grossly intact, speech clear,  Skin: Skin is warm and dry. Capillary refill takes less than 2 seconds. No rash noted.  Psychiatric: He has a normal mood and affect. Thought content normal.  Nursing note and vitals reviewed.    ED Treatments / Results  Labs (all labs ordered are listed, but only abnormal results are displayed) Labs Reviewed - No data to  display  EKG None  Radiology No results found.  Procedures Procedures (including critical care time)  Medications Ordered in ED Medications  acetaminophen (TYLENOL) tablet 650 mg (650 mg Oral Given 08/14/18 2023)     Initial Impression / Assessment and Plan / ED Course  I have reviewed the triage vital signs and the nursing notes.  Pertinent labs & imaging results that were available during my care of the patient were reviewed by me and considered in my medical decision making (see chart for details).     Upon entering the room, patient reporting headache has improved after eating.  There is fast food bags in the room.  Patient is laughing and talking with friend at bedside.  No focal neuro deficits.  No meningeal signs.  Patient appears appropriate for discharge home  Final Clinical Impressions(s) / ED Diagnoses   Final diagnoses:  Bad headache    ED Discharge Orders         Ordered    ibuprofen (ADVIL,MOTRIN) 800 MG tablet  3 times daily     08/14/18 2021           Pauline Aus, PA-C 08/16/18 0054    Raeford Razor, MD 08/24/18 2317

## 2018-12-07 ENCOUNTER — Other Ambulatory Visit: Payer: Self-pay

## 2018-12-07 ENCOUNTER — Encounter: Payer: Self-pay | Admitting: Emergency Medicine

## 2018-12-07 ENCOUNTER — Ambulatory Visit
Admission: EM | Admit: 2018-12-07 | Discharge: 2018-12-07 | Disposition: A | Discharge status: Home / Self Care | Payer: 59 | Attending: Family Medicine | Admitting: Family Medicine

## 2018-12-07 DIAGNOSIS — G43019 Migraine without aura, intractable, without status migrainosus: Secondary | ICD-10-CM | POA: Diagnosis not present

## 2018-12-07 HISTORY — DX: Personal history of other diseases of the nervous system and sense organs: Z86.69

## 2018-12-07 MED ORDER — IBUPROFEN 600 MG PO TABS
600.0000 mg | ORAL_TABLET | Freq: Once | ORAL | Status: AC
  Administered 2018-12-07: 600 mg via ORAL

## 2018-12-07 MED ORDER — IBUPROFEN 600 MG PO TABS: 600.0000 mg | ORAL_TABLET | Freq: Four times a day (QID) | ORAL | 1 refills | Status: AC | PRN

## 2018-12-07 NOTE — Discharge Instructions (Addendum)
You were given Ibuprofen today to help with the headache. You may also take Ibuprofen 600mg  every 6 hours as needed for headache and pain- Rx sent to your pharmacy. Continue to monitor symptoms and rest. Recommend keep a journal of frequency of headaches and possible triggers. Encouraged to take Ibuprofen or Tylenol as soon as headache starts so that it can be treated quickly. Follow-up here if headache does not improve or go to the ER if headache worsens.

## 2018-12-07 NOTE — ED Triage Notes (Signed)
Pt c/o headache. Started about 9 am this morning. Denies any other symptoms. He has not taken anything for this today.

## 2018-12-07 NOTE — ED Provider Notes (Signed)
[facility]    CSN: [id number] Arrival date & time: 12/07/18  1116     History   Chief Complaint Chief Complaint  Patient presents with  . Headache    HPI Fernando Kemp is a 23 y.o. male.   23 year old male presents with frontal headache that started at work at [employer]9am today. Pain continues in frontal area with associated photophobia, and increase in pain with movement. Denies any nausea, vomiting, dizziness or distinct vision changes. Also denies any URI symptoms, cough, numbness or other neurological symptoms. This headache is similar to previous migraine headaches. He usually takes Ibuprofen with good success but did not take any medication this morning. Sleep also helps. He has frequent tension-type headaches but only gets migraines every few months. He left work today due to the headache and needs a note. No other chronic health issues. Takes no daily medication.   The history is provided by the patient.    Past Medical History:  Diagnosis Date  . History of migraine headaches     There are no active problems to display for this patient.   Past Surgical History:  Procedure Laterality Date  . WRIST SURGERY Left        Home Medications    Prior to Admission medications   Medication Sig Start Date End Date Taking? Authorizing Provider  ibuprofen (ADVIL,MOTRIN) 600 MG tablet Take 1 tablet (600 mg total) by mouth every 6 (six) hours as needed for headache. 12/07/18   Isay Perleberg, Ali LoweAnn Berry, NP    Family History Family History  Problem Relation Age of Onset  . Healthy Mother   . Healthy Father     Social History Social History   Tobacco Use  . Smoking status: Never Smoker  . Smokeless tobacco: Never Used  Substance Use Topics  . Alcohol use: No  . Drug use: Not Currently    Types: Marijuana     Allergies   Patient has no known allergies.   Review of Systems Review of Systems  Constitutional: Negative for activity change, appetite change,  chills, fatigue and fever.  HENT: Negative for congestion, ear discharge, ear pain, facial swelling, hearing loss, mouth sores, nosebleeds, postnasal drip, rhinorrhea, sinus pressure, sinus pain, sneezing, sore throat, tinnitus and trouble swallowing.   Eyes: Positive for photophobia. Negative for pain, discharge, redness, itching and visual disturbance.  Respiratory: Negative for cough, chest tightness, shortness of breath and wheezing.   Cardiovascular: Negative for chest pain and palpitations.  Gastrointestinal: Negative for abdominal pain, diarrhea, nausea and vomiting.  Musculoskeletal: Negative for arthralgias, back pain, myalgias, neck pain and neck stiffness.  Skin: Negative for color change, rash and wound.  Allergic/Immunologic: Negative for environmental allergies and immunocompromised state.  Neurological: Positive for headaches. Negative for dizziness, tremors, seizures, syncope, facial asymmetry, speech difficulty, weakness, light-headedness and numbness.  Hematological: Negative for adenopathy. Does not bruise/bleed easily.     Physical Exam Triage Vital Signs ED Triage Vitals  Enc Vitals Group     BP 12/07/18 1246 130/82     Pulse Rate 12/07/18 1246 84     Resp 12/07/18 1246 18     Temp 12/07/18 1246 98.3 F (36.8 C)     Temp Source 12/07/18 1246 Oral     SpO2 12/07/18 1246 99 %     Weight 12/07/18 1244 186 lb (84.4 kg)     Height 12/07/18 1244 5\' 8"  (1.727 m)     Head Circumference --  Peak Flow --      Pain Score 12/07/18 1244 7     Pain Loc --      Pain Edu? --      Excl. in [place]? --    No data found.  Updated Vital Signs BP 130/82 (BP Location: Left Arm)   Pulse 84   Temp 98.3 F (36.8 C) (Oral)   Resp 18   Ht 5\' 8"  (1.727 m)   Wt 186 lb (84.4 kg)   SpO2 99%   BMI 28.28 kg/m   Visual Acuity Right Eye Distance:   Left Eye Distance:   Bilateral Distance:    Right Eye Near:   Left Eye Near:    Bilateral Near:     Physical Exam Vitals signs  reviewed.  Constitutional:      General: He is awake. He is not in acute distress.    Appearance: Normal appearance. He is well-developed, well-groomed and normal weight. He is not ill-appearing.     Comments: Patient sitting comfortably on exam table in no acute distress.   HENT:     Head: Normocephalic and atraumatic.     Jaw: There is normal jaw occlusion.     Right Ear: Hearing, tympanic membrane, ear canal and external ear normal.     Left Ear: Hearing, tympanic membrane, ear canal and external ear normal.     Nose: Nose normal.     Mouth/Throat:     Lips: Pink.     Mouth: Mucous membranes are moist.     Pharynx: Oropharynx is clear.  Eyes:     Extraocular Movements: Extraocular movements intact.     Conjunctiva/sclera: Conjunctivae normal.     Pupils: Pupils are equal, round, and reactive to light.  Neck:     Musculoskeletal: Normal range of motion and neck supple. No neck rigidity or muscular tenderness.  Cardiovascular:     Rate and Rhythm: Normal rate and regular rhythm.     Pulses: Normal pulses.     Heart sounds: Normal heart sounds. No murmur.  Pulmonary:     Effort: Pulmonary effort is normal. No respiratory distress.     Breath sounds: Normal air entry. No decreased breath sounds, wheezing, rhonchi or rales.  Musculoskeletal: Normal range of motion.  Skin:    General: Skin is warm and dry.     Capillary Refill: Capillary refill takes less than 2 seconds.     Findings: No rash.  Neurological:     General: No focal deficit present.     Mental Status: He is alert and oriented to person, place, and time.     Cranial Nerves: Cranial nerves are intact.     Sensory: Sensation is intact.     Motor: Motor function is intact.     Coordination: Coordination is intact.     Gait: Gait is intact.     Deep Tendon Reflexes: Reflexes are normal and symmetric.  Psychiatric:        Attention and Perception: Attention normal.        Mood and Affect: Mood and affect normal.         Speech: Speech normal.        Behavior: Behavior normal. Behavior is cooperative.        Thought Content: Thought content normal.        Judgment: Judgment normal.      UC Treatments / Results  Labs (all labs ordered are listed, but only abnormal results are displayed) Labs Reviewed -  No data to display  EKG None  Radiology No results found.  Procedures Procedures (including critical care time)  Medications Ordered in UC Medications  ibuprofen (ADVIL,MOTRIN) tablet 600 mg (600 mg Oral Given 12/07/18 1416)    Initial Impression / Assessment and Plan / UC Course  I have reviewed the triage vital signs and the nursing notes.  Pertinent labs & imaging results that were available during my care of the patient were reviewed by me and considered in my medical decision making (see chart for details).    Gave Ibuprofen 600mg  now to help with pain/headache. Offered Naproxen or longer acting anti-inflammatory to help with headaches but patient declined. May continue using Ibuprofen 600mg  every 8 hours as needed for headache. Encouraged to take Ibuprofen as soon as headache starts to minimize severity of headache. Recommend continue to monitor headache pattern to determine any triggers. Note written for work for today. Follow-up here in 2 to 3 days if headache does not resolve or go to the ER if pain/headache worsens.  Final Clinical Impressions(s) / UC Diagnoses   Final diagnoses:  Intractable migraine without aura and without status migrainosus     Discharge Instructions     You were given Ibuprofen today to help with the headache. You may also take Ibuprofen 600mg  every 6 hours as needed for headache and pain- Rx sent to your pharmacy. Continue to monitor symptoms and rest. Recommend keep a journal of frequency of headaches and possible triggers. Encouraged to take Ibuprofen or Tylenol as soon as headache starts so that it can be treated quickly. Follow-up here if headache does not  improve or go to the ER if headache worsens.     ED Prescriptions    Medication Sig Dispense Auth. Provider   ibuprofen (ADVIL,MOTRIN) 600 MG tablet Take 1 tablet (600 mg total) by mouth every 6 (six) hours as needed for headache. 30 tablet Sudie Grumbling, NP     Controlled Substance Prescriptions [place] Controlled Substance Registry consulted? N/A   Sudie Grumbling, NP 12/07/18 [phone]

## 2019-01-07 ENCOUNTER — Other Ambulatory Visit: Payer: Self-pay

## 2019-01-07 ENCOUNTER — Encounter: Payer: Self-pay | Admitting: *Deleted

## 2019-01-07 ENCOUNTER — Emergency Department
Admission: EM | Admit: 2019-01-07 | Discharge: 2019-01-07 | Disposition: A | Discharge status: Home / Self Care | Payer: 59 | Attending: Emergency Medicine | Admitting: Emergency Medicine

## 2019-01-07 DIAGNOSIS — J029 Acute pharyngitis, unspecified: Secondary | ICD-10-CM

## 2019-01-07 LAB — GROUP A STREP BY PCR: GROUP A STREP BY PCR: NOT DETECTED

## 2019-01-07 MED ORDER — ACETAMINOPHEN 325 MG PO TABS
650.0000 mg | ORAL_TABLET | Freq: Once | ORAL | Status: DC | PRN
  Filled 2019-01-07: qty 2

## 2019-01-07 MED ORDER — NAPROXEN 500 MG PO TABS: 500.0000 mg | ORAL_TABLET | Freq: Two times a day (BID) | ORAL | 0 refills | Status: DC

## 2019-01-07 NOTE — ED Provider Notes (Signed)
[facility] Emergency Department Provider Note  ____________________________________________  Time seen: Approximately 10:04 PM  I have reviewed the triage vital signs and the nursing notes.   HISTORY  Chief Complaint Sore Throat    HPI Fernando Kemp is a 23 y.o. male who presents to the emergency department for treatment and evaluation of sore throat that started this morning.  No relief with over-the-counter throat spray.  Pain increases with swallowing.   Past Medical History:  Diagnosis Date  . History of migraine headaches     There are no active problems to display for this patient.   Past Surgical History:  Procedure Laterality Date  . WRIST SURGERY Left     Prior to Admission medications   Medication Sig Start Date End Date Taking? Authorizing Provider  ibuprofen (ADVIL,MOTRIN) 600 MG tablet Take 1 tablet (600 mg total) by mouth every 6 (six) hours as needed for headache. 12/07/18   Amyot, Ali Lowe, NP  naproxen (NAPROSYN) 500 MG tablet Take 1 tablet (500 mg total) by mouth 2 (two) times daily with a meal. 01/07/19   Mervil Wacker B, FNP    Allergies Patient has no known allergies.  Family History  Problem Relation Age of Onset  . Healthy Mother   . Healthy Father     Social History Social History   Tobacco Use  . Smoking status: Never Smoker  . Smokeless tobacco: Never Used  Substance Use Topics  . Alcohol use: No  . Drug use: Not Currently    Types: Marijuana    Review of Systems Constitutional: Negative for fever. Eyes: No visual changes. ENT: Positive for sore throat; negative for difficulty swallowing. Respiratory: Denies shortness of breath. Gastrointestinal: Negative for abdominal pain.  No nausea, no vomiting.  No diarrhea.  Genitourinary: Negative for dysuria. Negative for decrease in need to void. Musculoskeletal: Negative for generalized body aches. Skin: Negative for rash. Neurological: Negative for  headaches, negative for focal weakness or numbness.  ____________________________________________   PHYSICAL EXAM:  VITAL SIGNS: ED Triage Vitals  Enc Vitals Group     BP 01/07/19 2032 124/86     Pulse Rate 01/07/19 2032 (!) 104     Resp 01/07/19 2032 20     Temp 01/07/19 2032 98.6 F (37 C)     Temp Source 01/07/19 2032 Oral     SpO2 01/07/19 2032 99 %     Weight 01/07/19 2033 189 lb (85.7 kg)     Height 01/07/19 2033 5\' 8"  (1.727 m)     Head Circumference --      Peak Flow --      Pain Score 01/07/19 2040 6     Pain Loc --      Pain Edu? --      Excl. in GC? --     Constitutional: Alert and oriented. Well appearing and in no acute distress. Eyes: Conjunctivae are normal.  Head: Atraumatic. Nose: No congestion/rhinnorhea. Mouth/Throat: Mucous membranes are moist.  Oropharynx mildly erythematous, tonsils 1+ without exudate. Uvula is midline. Ears: Right tympanic membrane appears normal. Left tympanic membrane appears normal. Neck: No stridor. Voice normal Lymphatic: Anterior cervical nodes not palpable. Cardiovascular: Normal rate, regular rhythm. Good peripheral circulation. Respiratory: Normal respiratory effort. Lungs CTAB. Gastrointestinal: Soft and nontender. Musculoskeletal: FROM of neck, upper and lower extremities. Neurologic:  Normal speech and language. No gross focal neurologic deficits are appreciated. Skin:  Skin is warm, dry and intact. No rash noted Psychiatric: Mood and affect are  normal. Speech and behavior are normal.  ____________________________________________   LABS (all labs ordered are listed, but only abnormal results are displayed)  Labs Reviewed  GROUP A STREP BY PCR   ____________________________________________  EKG  Not indiated ____________________________________________  RADIOLOGY  Not indicated. ____________________________________________   PROCEDURES  Procedure(s) performed: None  Critical Care performed:  No ____________________________________________   INITIAL IMPRESSION / ASSESSMENT AND PLAN / ED COURSE  23 year old male presents to the emergency department for evaluation of sore throat that started this morning.  Strep by PCR is negative.  He will be treated symptomatically with Naprosyn.  Patient requested a work note for today as well.  He was advised to follow-up with urgent care with a primary care provider of his choice for symptoms that are not improving over the next few days.  He is to return to the emergency department for symptoms of change or worsen if he is unable to schedule appointment.  Pertinent labs & imaging results that were available during my care of the patient were reviewed by me and considered in my medical decision making (see chart for details). ____________________________________________  New Prescriptions   NAPROXEN (NAPROSYN) 500 MG TABLET    Take 1 tablet (500 mg total) by mouth 2 (two) times daily with a meal.    FINAL CLINICAL IMPRESSION(S) / ED DIAGNOSES  Final diagnoses:  Viral pharyngitis    If controlled substance prescribed during this visit, 12 month history viewed on the [facility] prior to issuing an initial prescription for Schedule II or III opiod.   Note:  This document was prepared using Dragon voice recognition software and may include unintentional dictation errors.    Chinita Pester, FNP 01/07/19 2216    Phineas Semen, MD 01/07/19 [phone]

## 2019-01-07 NOTE — ED Triage Notes (Signed)
Pt c/o sore throat since this morning. Pt states he has used OTC "throat spray" for his pain w/o relief. Pt has taken no other meds for pain. Pt denies fever prior to arrival and is presently afebrile. Pt c/o pain w/ swallowing but is controlling secretions and able to drink fluids.

## 2019-09-13 ENCOUNTER — Encounter: Payer: Self-pay | Admitting: Emergency Medicine

## 2019-09-13 ENCOUNTER — Other Ambulatory Visit: Payer: Self-pay

## 2019-09-13 ENCOUNTER — Ambulatory Visit
Admission: EM | Admit: 2019-09-13 | Discharge: 2019-09-13 | Disposition: A | Discharge status: Home / Self Care | Payer: 59 | Attending: Family Medicine | Admitting: Family Medicine

## 2019-09-13 DIAGNOSIS — R009 Unspecified abnormalities of heart beat: Secondary | ICD-10-CM | POA: Diagnosis not present

## 2019-09-13 DIAGNOSIS — R519 Headache, unspecified: Secondary | ICD-10-CM

## 2019-09-13 DIAGNOSIS — J029 Acute pharyngitis, unspecified: Secondary | ICD-10-CM | POA: Insufficient documentation

## 2019-09-13 LAB — RAPID STREP SCREEN (MED CTR MEBANE ONLY): Streptococcus, Group A Screen (Direct): NEGATIVE

## 2019-09-13 NOTE — ED Provider Notes (Signed)
[facility]    CSN: [id number] Arrival date & time: 09/13/19  0920  History   Chief Complaint Chief Complaint  Patient presents with  . Sore Throat  . Headache   HPI  23 year old male presents with the above complaints.  Patient reports intermittent headaches over the past week.  Developed sore throat this morning.  Patient states that he has had coworkers test positive with COVID-19.  He is concerned about COVID-19 and would like to be tested.  Denies fever.  No reports of cough or shortness of breath.  No known exacerbating relieving factors.  He rates his pain as 5/10 in severity.  No other complaints or concerns at this time.  PMH, Surgical Hx, Family Hx, Social History reviewed and updated as below.  Past Medical History:  Diagnosis Date  . History of migraine headaches    Past Surgical History:  Procedure Laterality Date  . WRIST SURGERY Left    Home Medications    Prior to Admission medications   Medication Sig Start Date End Date Taking? Authorizing Provider  ibuprofen (ADVIL,MOTRIN) 600 MG tablet Take 1 tablet (600 mg total) by mouth every 6 (six) hours as needed for headache. 12/07/18   Amyot, Nicholes Stairs, NP    Family History Family History  Problem Relation Age of Onset  . Healthy Mother   . Healthy Father     Social History Social History   Tobacco Use  . Smoking status: Never Smoker  . Smokeless tobacco: Never Used  Substance Use Topics  . Alcohol use: No  . Drug use: Not Currently    Types: Marijuana     Allergies   Patient has no known allergies.   Review of Systems Review of Systems  Constitutional: Negative for fever.  HENT: Positive for sore throat.   Neurological: Positive for headaches.   Physical Exam Triage Vital Signs ED Triage Vitals  Enc Vitals Group     BP 09/13/19 0934 124/84     Pulse Rate 09/13/19 0934 65     Resp 09/13/19 0934 16     Temp 09/13/19 0934 98.7 F (37.1 C)     Temp Source 09/13/19 0934  Oral     SpO2 09/13/19 0934 97 %     Weight 09/13/19 0931 196 lb (88.9 kg)     Height 09/13/19 0931 5\' 8"  (1.727 m)     Head Circumference --      Peak Flow --      Pain Score 09/13/19 0930 5     Pain Loc --      Pain Edu? --      Excl. in [place]? --    Updated Vital Signs BP 124/84 (BP Location: Left Arm)   Pulse 65   Temp 98.7 F (37.1 C) (Oral)   Resp 16   Ht 5\' 8"  (1.727 m)   Wt 88.9 kg   SpO2 97%   BMI 29.80 kg/m   Visual Acuity Right Eye Distance:   Left Eye Distance:   Bilateral Distance:    Right Eye Near:   Left Eye Near:    Bilateral Near:     Physical Exam Vitals signs and nursing note reviewed.  Constitutional:      General: He is not in acute distress.    Appearance: Normal appearance. He is not ill-appearing.  HENT:     Head: Normocephalic and atraumatic.     Right Ear: Tympanic membrane normal.     Left Ear: Tympanic membrane  normal.     Mouth/Throat:     Pharynx: Posterior oropharyngeal erythema present. No oropharyngeal exudate.  Eyes:     General:        Right eye: No discharge.        Left eye: No discharge.     Conjunctiva/sclera: Conjunctivae normal.  Cardiovascular:     Rate and Rhythm: Normal rate. Rhythm regularly irregular.  Pulmonary:     Effort: Pulmonary effort is normal.     Breath sounds: Normal breath sounds. No wheezing, rhonchi or rales.  Neurological:     Mental Status: He is alert.  Psychiatric:        Mood and Affect: Mood normal.        Behavior: Behavior normal.    UC Treatments / Results  Labs (all labs ordered are listed, but only abnormal results are displayed) Labs Reviewed  RAPID STREP SCREEN ([facility] ONLY)  NOVEL CORONAVIRUS, NAA (HOSP ORDER, SEND-OUT TO REF LAB; TAT 18-24 HRS)  CULTURE, GROUP A STREP [facility])    EKG Interpretation: Sinus rhythm with PACs with aberrant conduction.  Normal axis.  No ST or T wave changes.  Radiology No results found.  Procedures Procedures (including critical care  time)  Medications Ordered in UC Medications - No data to display  Initial Impression / Assessment and Plan / UC Course  I have reviewed the triage vital signs and the nursing notes.  Pertinent labs & imaging results that were available during my care of the patient were reviewed by me and considered in my medical decision making (see chart for details).    23 year old male presents with pharyngitis.  Strep negative.  Awaiting Covid test results.  Irregular heartbeat noted on exam.  See EKG findings above.  Advised to decrease caffeine intake.  Ibuprofen as needed.  Rest and supportive care.  Work note given.  Final Clinical Impressions(s) / UC Diagnoses   Final diagnoses:  Pharyngitis, unspecified etiology     Discharge Instructions     Rest.  Fluids.  Ibuprofen as needed.  Awaiting COVID test results.  Take care  Dr. Adriana Simas     ED Prescriptions    None     PDMP not reviewed this encounter.   Tommie Sams, DO 09/13/19 1037

## 2019-09-13 NOTE — ED Triage Notes (Signed)
Patient c/o sore throat and HAs that started today.  Patient states that a coworker tested positive a week ago.

## 2019-09-13 NOTE — Discharge Instructions (Signed)
Rest.  Fluids.  Ibuprofen as needed.  Awaiting COVID test results.  Take care  Dr. Lacinda Axon

## 2019-09-14 LAB — NOVEL CORONAVIRUS, NAA (HOSP ORDER, SEND-OUT TO REF LAB; TAT 18-24 HRS): SARS-CoV-2, NAA: NOT DETECTED
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
## 2019-09-16 LAB — CULTURE, GROUP A STREP (THRC)

## 2021-06-24 ENCOUNTER — Encounter: Payer: Self-pay | Admitting: Emergency Medicine

## 2021-06-24 ENCOUNTER — Other Ambulatory Visit: Payer: Self-pay

## 2021-06-24 ENCOUNTER — Ambulatory Visit
Admission: EM | Admit: 2021-06-24 | Discharge: 2021-06-24 | Disposition: A | Discharge status: Home / Self Care | Payer: 59 | Attending: Physician Assistant | Admitting: Physician Assistant

## 2021-06-24 DIAGNOSIS — B349 Viral infection, unspecified: Secondary | ICD-10-CM | POA: Insufficient documentation

## 2021-06-24 DIAGNOSIS — Z20822 Contact with and (suspected) exposure to covid-19: Secondary | ICD-10-CM | POA: Insufficient documentation

## 2021-06-24 DIAGNOSIS — J029 Acute pharyngitis, unspecified: Secondary | ICD-10-CM | POA: Diagnosis present

## 2021-06-24 DIAGNOSIS — M791 Myalgia, unspecified site: Secondary | ICD-10-CM | POA: Insufficient documentation

## 2021-06-24 LAB — POCT RAPID STREP A: Streptococcus, Group A Screen (Direct): NEGATIVE

## 2021-06-24 NOTE — ED Provider Notes (Signed)
[facility]    CSN: [id number] Arrival date & time: 06/24/21  1716      History   Chief Complaint Chief Complaint  Patient presents with   Generalized Body Aches   Covid Exposure    HPI Fernando Kemp is a 25 y.o. male presenting for onset of feeling feverish, scratchy/sore throat, and body aches (mostly lower backache) today.  Patient states he was exposed to COVID-19 through a coworker.  Patient has been vaccinated x2 for COVID-19.  He denies any recorded temperatures, headaches, cough, congestion/runny nose, chest pain or breathing difficulty, vomiting or diarrhea.  Not currently taking any OTC meds for symptoms.  He is otherwise healthy with only history of migraines.  No other complaints or concerns.  HPI  Past Medical History:  Diagnosis Date   History of migraine headaches     There are no problems to display for this patient.   Past Surgical History:  Procedure Laterality Date   WRIST SURGERY Left        Home Medications    Prior to Admission medications   Medication Sig Start Date End Date Taking? Authorizing Provider  ibuprofen (ADVIL,MOTRIN) 600 MG tablet Take 1 tablet (600 mg total) by mouth every 6 (six) hours as needed for headache. 12/07/18   Amyot, Ali Lowe, NP    Family History Family History  Problem Relation Age of Onset   Healthy Mother    Healthy Father     Social History Social History   Tobacco Use   Smoking status: Never   Smokeless tobacco: Never  Vaping Use   Vaping Use: Never used  Substance Use Topics   Alcohol use: No   Drug use: Not Currently    Types: Marijuana     Allergies   Patient has no known allergies.   Review of Systems Review of Systems  Constitutional:  Positive for fever. Negative for fatigue.  HENT:  Positive for sore throat. Negative for congestion, rhinorrhea, sinus pressure and sinus pain.   Respiratory:  Negative for cough and shortness of breath.   Gastrointestinal:  Negative for  abdominal pain, diarrhea, nausea and vomiting.  Musculoskeletal:  Positive for myalgias.  Neurological:  Negative for weakness, light-headedness and headaches.  Hematological:  Negative for adenopathy.    Physical Exam Triage Vital Signs ED Triage Vitals  Enc Vitals Group     BP 06/24/21 1733 121/78     Pulse Rate 06/24/21 1733 (!) 107     Resp 06/24/21 1733 18     Temp 06/24/21 1733 99.1 F (37.3 C)     Temp Source 06/24/21 1733 Oral     SpO2 06/24/21 1733 98 %     Weight 06/24/21 1732 195 lb 15.8 oz (88.9 kg)     Height 06/24/21 1732 5\' 8"  (1.727 m)     Head Circumference --      Peak Flow --      Pain Score 06/24/21 1732 7     Pain Loc --      Pain Edu? --      Excl. in GC? --    No data found.  Updated Vital Signs BP 121/78 (BP Location: Left Arm)   Pulse (!) 107   Temp 99.1 F (37.3 C) (Oral)   Resp 18   Ht 5\' 8"  (1.727 m)   Wt 195 lb 15.8 oz (88.9 kg)   SpO2 98%   BMI 29.80 kg/m   Physical Exam Vitals and nursing note reviewed.  Constitutional:      General: He is not in acute distress.    Appearance: Normal appearance. He is well-developed. He is not ill-appearing or diaphoretic.  HENT:     Head: Normocephalic and atraumatic.     Nose: Nose normal. No congestion.     Mouth/Throat:     Mouth: Mucous membranes are moist.     Pharynx: Oropharynx is clear. Uvula midline. Posterior oropharyngeal erythema present.     Tonsils: No tonsillar abscesses.  Eyes:     General: No scleral icterus.       Right eye: No discharge.        Left eye: No discharge.     Conjunctiva/sclera: Conjunctivae normal.  Neck:     Thyroid: No thyromegaly.     Trachea: No tracheal deviation.  Cardiovascular:     Rate and Rhythm: Regular rhythm. Tachycardia present.     Heart sounds: Normal heart sounds.  Pulmonary:     Effort: Pulmonary effort is normal. No respiratory distress.     Breath sounds: Normal breath sounds. No wheezing, rhonchi or rales.  Musculoskeletal:      Cervical back: Normal range of motion and neck supple.  Lymphadenopathy:     Cervical: No cervical adenopathy.  Skin:    General: Skin is warm and dry.     Findings: No rash.  Neurological:     General: No focal deficit present.     Mental Status: He is alert. Mental status is at baseline.     Motor: No weakness.     Gait: Gait normal.  Psychiatric:        Mood and Affect: Mood normal.        Behavior: Behavior normal.        Thought Content: Thought content normal.     UC Treatments / Results  Labs (all labs ordered are listed, but only abnormal results are displayed) Labs Reviewed  SARS CORONAVIRUS 2 (TAT 6-24 HRS)  CULTURE, GROUP A STREP [facility])  POCT RAPID STREP A, ED / UC  POCT RAPID STREP A    EKG   Radiology No results found.  Procedures Procedures (including critical care time)  Medications Ordered in UC Medications - No data to display  Initial Impression / Assessment and Plan / UC Course  I have reviewed the triage vital signs and the nursing notes.  Pertinent labs & imaging results that were available during my care of the patient were reviewed by me and considered in my medical decision making (see chart for details).  25 year old male presenting for onset of scratchy throat, body aches and feeling feverish today.  Possible exposure to COVID-19 through a coworker.  Vaccinated for COVID-19 x2.  Vitals all normal and stable patient is overall well-appearing.  He has mild posterior pharyngeal erythema.  The remainder the exam is within normal limits.  Rapid strep negative.  Culture sent.  PCR COVID test obtained.  Current CDC guidelines, isolation protocol and ED precautions reviewed with patient.  Advised patient if he has been exposed, there is a decent possibility he could have COVID-19 given any symptoms.  Supportive care encouraged with OTC medications.  Reviewed that if his COVID test is negative he likely has another viral URI which will take 7 to 10  days or his course and care still supportive.  Advise follow-up with [place] as needed.  Work note given.  Final Clinical Impressions(s) / UC Diagnoses   Final diagnoses:  Viral illness  Sore throat  Myalgia  Exposure to COVID-19 virus     Discharge Instructions      The rapid strep test was negative.  We are sending a culture and the culture ends up being positive in the next couple days we will call you and send in antibiotics.  Your symptoms are more likely due to a viral illness.  Since you have potentially been exposed to COVID-19, this is a possibility.  Our PCR test will be back tomorrow.  If it is positive you have COVID.  You can take an at home COVID test.  If it is positive you have COVID if it is negative and our test is positive then you have COVID.  Treatment is supportive so increase rest and fluids and can take Tylenol and or ibuprofen as needed for pain/discomfort.  Consider use of throat lozenges and Chloraseptic sprays.  See more information below.  If negative COVID test, this is likely other viral illness and should run its course in the next 7 to 10 days.  Care is still supportive.  You have received COVID testing today either for positive exposure, concerning symptoms that could be related to COVID infection, screening purposes, or re-testing after confirmed positive.  Your test obtained today checks for active viral infection in the last 1-2 weeks. If your test is negative now, you can still test positive later. So, if you do develop symptoms you should either get re-tested and/or isolate x 5 days and then strict mask use x 5 days (unvaccinated) or mask use x 10 days (vaccinated). Please follow CDC guidelines.  While Rapid antigen tests come back in 15-20 minutes, send out PCR/molecular test results typically come back within 1-3 days. In the mean time, if you are symptomatic, assume this could be a positive test and treat/monitor yourself as if you do have COVID.   We will  call with test results if positive. Please download the MyChart app and set up a profile to access test results.   If symptomatic, go home and rest. Push fluids. Take Tylenol as needed for discomfort. Gargle warm salt water. Throat lozenges. Take Mucinex DM or Robitussin for cough. Humidifier in bedroom to ease coughing. Warm showers. Also review the COVID handout for more information.  COVID-19 INFECTION: The incubation period of COVID-19 is approximately 14 days after exposure, with most symptoms developing in roughly 4-5 days. Symptoms may range in severity from mild to critically severe. Roughly 80% of those infected will have mild symptoms. People of any age may become infected with COVID-19 and have the ability to transmit the virus. The most common symptoms include: fever, fatigue, cough, body aches, headaches, sore throat, nasal congestion, shortness of breath, nausea, vomiting, diarrhea, changes in smell and/or taste.    COURSE OF ILLNESS Some patients may begin with mild disease which can progress quickly into critical symptoms. If your symptoms are worsening please call ahead to the Emergency Department and proceed there for further treatment. Recovery time appears to be roughly 1-2 weeks for mild symptoms and 3-6 weeks for severe disease.   GO IMMEDIATELY TO ER FOR FEVER YOU ARE UNABLE TO GET DOWN WITH TYLENOL, BREATHING PROBLEMS, CHEST PAIN, FATIGUE, LETHARGY, INABILITY TO EAT OR DRINK, ETC  QUARANTINE AND ISOLATION: To help decrease the spread of COVID-19 please remain isolated if you have COVID infection or are highly suspected to have COVID infection. This means -stay home and isolate to one room in the home if you live with others. Do not share  a bed or bathroom with others while ill, sanitize and wipe down all countertops and keep common areas clean and disinfected. Stay home for 5 days. If you have no symptoms or your symptoms are resolving after 5 days, you can leave your  house. Continue to wear a mask around others for 5 additional days. If you have been in close contact (within 6 feet) of someone diagnosed with COVID 19, you are advised to quarantine in your home for 14 days as symptoms can develop anywhere from 2-14 days after exposure to the virus. If you develop symptoms, you  must isolate.  Most current guidelines for COVID after exposure -unvaccinated: isolate 5 days and strict mask use x 5 days. Test on day 5 is possible -vaccinated: wear mask x 10 days if symptoms do not develop -You do not necessarily need to be tested for COVID if you have + exposure and  develop symptoms. Just isolate at home x10 days from symptom onset During this global pandemic, CDC advises to practice social distancing, try to stay at least 196ft away from others at all times. Wear a face covering. Wash and sanitize your hands regularly and avoid going anywhere that is not necessary.  KEEP IN MIND THAT THE COVID TEST IS NOT 100% ACCURATE AND YOU SHOULD STILL DO EVERYTHING TO PREVENT POTENTIAL SPREAD OF VIRUS TO OTHERS (WEAR MASK, WEAR GLOVES, WASH HANDS AND SANITIZE REGULARLY). IF INITIAL TEST IS NEGATIVE, THIS MAY NOT MEAN YOU ARE DEFINITELY NEGATIVE. MOST ACCURATE TESTING IS DONE 5-7 DAYS AFTER EXPOSURE.   It is not advised by CDC to get re-tested after receiving a positive COVID test since you can still test positive for weeks to months after you have already cleared the virus.   *If you have not been vaccinated for COVID, I strongly suggest you consider getting vaccinated as long as there are no contraindications.       ED Prescriptions   None    PDMP not reviewed this encounter.   Shirlee Latchaves, Jamel Holzmann B, PA-C 06/24/21 1803

## 2021-06-24 NOTE — ED Triage Notes (Signed)
Pt c/o scratchy throat, body aches and subjective fever. Started this morning. He states he was exposed to covid by someone at work.

## 2021-06-24 NOTE — Discharge Instructions (Addendum)
The rapid strep test was negative.  We are sending a culture and the culture ends up being positive in the next couple days we will call you and send in antibiotics.  Your symptoms are more likely due to a viral illness.  Since you have potentially been exposed to COVID-19, this is a possibility.  Our PCR test will be back tomorrow.  If it is positive you have COVID.  You can take an at home COVID test.  If it is positive you have COVID if it is negative and our test is positive then you have COVID.  Treatment is supportive so increase rest and fluids and can take Tylenol and or ibuprofen as needed for pain/discomfort.  Consider use of throat lozenges and Chloraseptic sprays.  See more information below.  If negative COVID test, this is likely other viral illness and should run its course in the next 7 to 10 days.  Care is still supportive.  You have received COVID testing today either for positive exposure, concerning symptoms that could be related to COVID infection, screening purposes, or re-testing after confirmed positive.  Your test obtained today checks for active viral infection in the last 1-2 weeks. If your test is negative now, you can still test positive later. So, if you do develop symptoms you should either get re-tested and/or isolate x 5 days and then strict mask use x 5 days (unvaccinated) or mask use x 10 days (vaccinated). Please follow CDC guidelines.  While Rapid antigen tests come back in 15-20 minutes, send out PCR/molecular test results typically come back within 1-3 days. In the mean time, if you are symptomatic, assume this could be a positive test and treat/monitor yourself as if you do have COVID.   We will call with test results if positive. Please download the MyChart app and set up a profile to access test results.   If symptomatic, go home and rest. Push fluids. Take Tylenol as needed for discomfort. Gargle warm salt water. Throat lozenges. Take Mucinex DM or Robitussin for  cough. Humidifier in bedroom to ease coughing. Warm showers. Also review the COVID handout for more information.  COVID-19 INFECTION: The incubation period of COVID-19 is approximately 14 days after exposure, with most symptoms developing in roughly 4-5 days. Symptoms may range in severity from mild to critically severe. Roughly 80% of those infected will have mild symptoms. People of any age may become infected with COVID-19 and have the ability to transmit the virus. The most common symptoms include: fever, fatigue, cough, body aches, headaches, sore throat, nasal congestion, shortness of breath, nausea, vomiting, diarrhea, changes in smell and/or taste.    COURSE OF ILLNESS Some patients may begin with mild disease which can progress quickly into critical symptoms. If your symptoms are worsening please call ahead to the Emergency Department and proceed there for further treatment. Recovery time appears to be roughly 1-2 weeks for mild symptoms and 3-6 weeks for severe disease.   GO IMMEDIATELY TO ER FOR FEVER YOU ARE UNABLE TO GET DOWN WITH TYLENOL, BREATHING PROBLEMS, CHEST PAIN, FATIGUE, LETHARGY, INABILITY TO EAT OR DRINK, ETC  QUARANTINE AND ISOLATION: To help decrease the spread of COVID-19 please remain isolated if you have COVID infection or are highly suspected to have COVID infection. This means -stay home and isolate to one room in the home if you live with others. Do not share a bed or bathroom with others while ill, sanitize and wipe down all countertops and keep common  areas clean and disinfected. Stay home for 5 days. If you have no symptoms or your symptoms are resolving after 5 days, you can leave your house. Continue to wear a mask around others for 5 additional days. If you have been in close contact (within 6 feet) of someone diagnosed with COVID 19, you are advised to quarantine in your home for 14 days as symptoms can develop anywhere from 2-14 days after exposure to the virus. If  you develop symptoms, you  must isolate.  Most current guidelines for COVID after exposure -unvaccinated: isolate 5 days and strict mask use x 5 days. Test on day 5 is possible -vaccinated: wear mask x 10 days if symptoms do not develop -You do not necessarily need to be tested for COVID if you have + exposure and  develop symptoms. Just isolate at home x10 days from symptom onset During this global pandemic, CDC advises to practice social distancing, try to stay at least 21ft away from others at all times. Wear a face covering. Wash and sanitize your hands regularly and avoid going anywhere that is not necessary.  KEEP IN MIND THAT THE COVID TEST IS NOT 100% ACCURATE AND YOU SHOULD STILL DO EVERYTHING TO PREVENT POTENTIAL SPREAD OF VIRUS TO OTHERS (WEAR MASK, WEAR GLOVES, WASH HANDS AND SANITIZE REGULARLY). IF INITIAL TEST IS NEGATIVE, THIS MAY NOT MEAN YOU ARE DEFINITELY NEGATIVE. MOST ACCURATE TESTING IS DONE 5-7 DAYS AFTER EXPOSURE.   It is not advised by CDC to get re-tested after receiving a positive COVID test since you can still test positive for weeks to months after you have already cleared the virus.   *If you have not been vaccinated for COVID, I strongly suggest you consider getting vaccinated as long as there are no contraindications.

## 2021-06-25 LAB — SARS CORONAVIRUS 2 (TAT 6-24 HRS): SARS Coronavirus 2: NEGATIVE

## 2021-06-27 LAB — CULTURE, GROUP A STREP (THRC)
